# Patient Record
Sex: Female | Born: 1975 | Race: Black or African American | Hispanic: No | Marital: Single | Smoking: Never smoker
Health system: Southern US, Community
[De-identification: ages and names within clinical notes are randomized; demographics above are authoritative.]

## PROBLEM LIST (undated history)

## (undated) HISTORY — PX: ABDOMINAL HYSTERECTOMY: SHX81

---

## 2012-08-16 ENCOUNTER — Emergency Department (HOSPITAL_COMMUNITY)
Admission: EM | Admit: 2012-08-16 | Discharge: 2012-08-16 | Disposition: A | Discharge status: Home / Self Care | Payer: 59 | Attending: Emergency Medicine | Admitting: Emergency Medicine

## 2012-08-16 ENCOUNTER — Telehealth (HOSPITAL_COMMUNITY): Payer: Self-pay | Admitting: Emergency Medicine

## 2012-08-16 ENCOUNTER — Encounter (HOSPITAL_COMMUNITY): Payer: Self-pay | Admitting: Emergency Medicine

## 2012-08-16 DIAGNOSIS — M545 Low back pain, unspecified: Secondary | ICD-10-CM | POA: Insufficient documentation

## 2012-08-16 DIAGNOSIS — M549 Dorsalgia, unspecified: Secondary | ICD-10-CM

## 2012-08-16 DIAGNOSIS — I10 Essential (primary) hypertension: Secondary | ICD-10-CM | POA: Insufficient documentation

## 2012-08-16 MED ORDER — TRAMADOL HCL 50 MG PO TABS: 50.0000 mg | ORAL_TABLET | Freq: Four times a day (QID) | ORAL | Status: DC | PRN

## 2012-08-16 MED ORDER — IBUPROFEN 800 MG PO TABS: 800.0000 mg | ORAL_TABLET | Freq: Three times a day (TID) | ORAL | Status: DC

## 2012-08-16 MED ORDER — KETOROLAC TROMETHAMINE 30 MG/ML IJ SOLN
30.0000 mg | Freq: Once | INTRAMUSCULAR | Status: AC
  Administered 2012-08-16: 30 mg via INTRAMUSCULAR
  Filled 2012-08-16: qty 1

## 2012-08-16 NOTE — ED Provider Notes (Signed)
History     CSN: [id number]  Arrival date & time 08/16/12  7829   First MD Initiated Contact with Patient 08/16/12 [phone]      Chief Complaint  Patient presents with  . Back Pain    (Consider location/radiation/quality/duration/timing/severity/associated sxs/prior treatment) HPI  Patient is a morbidly obese 37 year old woman who is in generally good health. 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 He says she works 12 hour shifts at night and sits in a chair for all of those 12 hours watching a monitor. Since she started this job, she has had low back pain. The low back pain is continuous, aching, moderately severe and nonradiating.  She denies radiation of pain as well as paresthesias. She has not had any bowel or bladder incontinence. No genitourinary symptoms. No history of trauma or strain. She says she has tried a heating pad without improvement. She has also tried a lumbar support in her work care without improvement. She finished work today and this hospital and decided to come to the emergency department for evaluation.  History  Substance Use Topics  . Smoking status: Not on file  . Smokeless tobacco: Not on file  . Alcohol Use: Not on file    OB History   Grav Para Term Preterm Abortions TAB SAB Ect Mult Living                  Review of Systems Gen: no weight loss, fevers, chills, night sweats Eyes: no discharge or drainage, no occular pain or visual changes Nose: no epistaxis or rhinorrhea Mouth: no dental pain, no sore throat Neck: no neck pain Lungs: no SOB, cough, wheezing CV: no chest pain, palpitations, dependent edema or orthopnea Abd: no abdominal pain, nausea, vomiting GU: no dysuria or gross hematuria MSK: As per history of present illness, otherwise negative Neuro: no headache, no focal neurologic deficits Skin: no rash Psyche: negative.  Allergies  Review of patient's allergies indicates not on file.  Home Medications  No current outpatient  prescriptions on file.  BP 151/116  Pulse 106  Temp(Src) 98.9 F (37.2 C) (Oral)  Resp 16  SpO2 98%  Physical Exam Gen: well developed and well nourished appearing Head: NCAT Eyes: PERL, EOMI Nose: no epistaixis or rhinorrhea Mouth/throat: mucosa is moist and pink Neck: supple, no stridor Lungs: CTA B, no wheezing, rhonchi or rales cardio vascular: Regular rate and rhythm, no murmur, extremities appear well perfused Abd: soft, notender, nondistended, obese Back: No midline tenderness, there is tenderness to palpation over the paraspinal musculature bilaterally in the lumbar region. Negative straight leg raise Skin: no rashese, wnl Neuro: CN ii-xii grossly intact, no focal deficits, 5 over 5 motor strength is present in all major muscle groups of lower extremities bilaterally, brisk and symmetric patellar tendon reflexes Psyche; normal affect,  calm and cooperative.   ED Course  Procedures     MDM  Patient with what appears to be myofascial low back pain which is likely the result of a combination of obesity, poor core strength and prolonged sitting at work. I have demonstrated and described some stretching excersices and suggested that she explore yoga as a therapy for her pain. We will tx acute pain with Toradol in the ED ad prescribe short course of ibuprofen and tramadol.   The patient is incidentally noted to be hypertensive. She is asymptomatic. I have advised her of this finding and the need to monitor BPs and record several times a week. The patient  has a doctor in [facility] but requests that I refer her to a physician close to this hospital and I will be happy to do so.         Brandt Loosen, MD 08/16/12 [phone]

## 2012-08-16 NOTE — ED Notes (Signed)
PT. REPORTS LOW BACK PAIN ONSET 2 DAYS AGO , DENIES INJURY OR FALL , PAIN WORSE WITH MOVEMENT / CERTAIN POSITIONS , NO URINARY SYMPTOMS , PT. STATED SITTING LONG HOURS AT WORK . AMBULATORY.

## 2012-10-11 ENCOUNTER — Emergency Department (HOSPITAL_COMMUNITY)
Admission: EM | Admit: 2012-10-11 | Discharge: 2012-10-11 | Disposition: A | Discharge status: Home / Self Care | Payer: 59 | Attending: Emergency Medicine | Admitting: Emergency Medicine

## 2012-10-11 ENCOUNTER — Encounter (HOSPITAL_COMMUNITY): Payer: Self-pay

## 2012-10-11 DIAGNOSIS — M5380 Other specified dorsopathies, site unspecified: Secondary | ICD-10-CM | POA: Insufficient documentation

## 2012-10-11 DIAGNOSIS — M545 Low back pain, unspecified: Secondary | ICD-10-CM | POA: Insufficient documentation

## 2012-10-11 DIAGNOSIS — M549 Dorsalgia, unspecified: Secondary | ICD-10-CM

## 2012-10-11 MED ORDER — TRAMADOL HCL 50 MG PO TABS: 50.0000 mg | ORAL_TABLET | Freq: Four times a day (QID) | ORAL | Status: DC | PRN

## 2012-10-11 MED ORDER — GABAPENTIN 400 MG PO CAPS: 400.0000 mg | ORAL_CAPSULE | Freq: Two times a day (BID) | ORAL | Status: DC

## 2012-10-11 MED ORDER — KETOROLAC TROMETHAMINE 30 MG/ML IJ SOLN
30.0000 mg | Freq: Once | INTRAMUSCULAR | Status: AC
  Administered 2012-10-11: 30 mg via INTRAVENOUS
  Filled 2012-10-11: qty 1

## 2012-10-11 MED ORDER — IBUPROFEN 600 MG PO TABS: 600.0000 mg | ORAL_TABLET | Freq: Four times a day (QID) | ORAL | Status: DC | PRN

## 2012-10-11 NOTE — ED Notes (Signed)
Pt reports numbness and tingling that spreads down her left leg and travels to her left knee. Pt states she uses her heating pad and that is provides some relief but not total relief. Pt states she has pain when she walks, lays down, and sits. Pt states she fell four months ago in the shower and has been uncomfortable since. Pt states she worked a lot of overtime this past week. Pt states her pain developed significantly last night from what she was experiencing the past four months. Pt states this pain in unbearable now and she is unable to sleep and work.

## 2012-10-11 NOTE — ED Provider Notes (Signed)
CSN: [id number]     Arrival date & time 10/11/12  0606 History     First MD Initiated Contact with Patient 10/11/12 [phone]     Chief Complaint  Patient presents with  . Back Pain   (Consider location/radiation/quality/duration/timing/severity/associated sxs/prior Treatment) HPI Mariame Rybolt is a 37 y.o.female without any significant PMH presents to the ER with complaints of low back pain that started since her new job in March of 2014. She now has a sedentary job which requires her to sit for 12 hours at a time. She has been seen in the ER before for the same during a previous flair up last month. This is the same. She just got off work and came straight to the ED because her pain is so uncomfortable. She also reports being out of her Gapapentin for the past week. No new injury, no new symptoms. The pain is left sided sharp and sometimes dull, dependent on sitting or laying position with shooting pains going down her leg. No weakness.   No past medical history on file. Past Surgical History  Procedure Laterality Date  . Abdominal hysterectomy     No family history on file. History  Substance Use Topics  . Smoking status: Never Smoker   . Smokeless tobacco: Not on file  . Alcohol Use: No   OB History   Grav Para Term Preterm Abortions TAB SAB Ect Mult Living                 Review of Systems  Review of Systems  Gen: no weight loss, fevers, chills, night sweats  Eyes: no discharge or drainage, no occular pain or visual changes  Nose: no epistaxis or rhinorrhea  Mouth: no dental pain, no sore throat  Neck: no neck pain  Lungs:No wheezing, coughing or hemoptysis CV: no chest pain, palpitations, dependent edema or orthopnea  Abd: no abdominal pain, nausea, vomiting  GU: no dysuria or gross hematuria  MSK:  + back pain Neuro: no headache, no focal neurologic deficits  Skin: no abnormalities Psyche: negative.   Allergies  Review of patient's allergies indicates no known  allergies.  Home Medications   Current Outpatient Rx  Name  Route  Sig  Dispense  Refill  . ibuprofen (ADVIL,MOTRIN) 200 MG tablet   Oral   Take 400 mg by mouth every 6 (six) hours as needed for pain.         Marland Kitchen gabapentin (NEURONTIN) 400 MG capsule   Oral   Take 1 capsule (400 mg total) by mouth 2 (two) times daily.   60 capsule   0   . ibuprofen (ADVIL,MOTRIN) 600 MG tablet   Oral   Take 1 tablet (600 mg total) by mouth every 6 (six) hours as needed for pain.   30 tablet   0   . traMADol (ULTRAM) 50 MG tablet   Oral   Take 1 tablet (50 mg total) by mouth every 6 (six) hours as needed for pain.   20 tablet   0    BP 132/90  Pulse 101  Temp(Src) 98.7 F (37.1 C) (Oral)  Resp 14  SpO2 100% Physical Exam  Nursing note and vitals reviewed. Constitutional: She appears well-developed and well-nourished. No distress.  HENT:  Head: Normocephalic and atraumatic.  Eyes: Pupils are equal, round, and reactive to light.  Neck: Normal range of motion. Neck supple.  Cardiovascular: Normal rate and regular rhythm.   Pulmonary/Chest: Effort normal.  Abdominal: Soft.  Musculoskeletal:  Lumbar back: She exhibits tenderness, pain and spasm. She exhibits normal range of motion, no swelling, no edema, no deformity, no laceration and normal pulse.       Back:  Neurological: She is alert.  Skin: Skin is warm and dry.    ED Course   Procedures (including critical care time)  Labs Reviewed - No data to display No results found. 1. Back pain     MDM  Pt given referral to PCP and ortho. She now has insurance and will be able to follow-up.  Tramadol 30mg  Injection in ED.  Rx: Gapapentin, ultram, Ibuprofen  36 y.o.Tameisha Nies's evaluation in the Emergency Department is complete. It has been determined that no acute conditions requiring further emergency intervention are present at this time. The patient/guardian have been advised of the diagnosis and plan. We have  discussed signs and symptoms that warrant return to the ED, such as changes or worsening in symptoms.  Vital signs are stable at discharge. Filed Vitals:   10/11/12 0634  BP: 132/90  Pulse: 101  Temp:   Resp: 14    Patient/guardian has voiced understanding and agreed to follow-up with the PCP or specialist.   Dorthula Matas, PA-C 10/11/12 8295

## 2012-10-11 NOTE — ED Provider Notes (Signed)
Medical screening examination/treatment/procedure(s) were performed by non-physician practitioner and as supervising physician I was immediately available for consultation/collaboration.  John-Adam Yafet Cline, M.D.     John-Adam Beila Purdie, MD 10/11/12 0803 

## 2012-10-11 NOTE — ED Notes (Signed)
Pt complains of lower back pain onset yesterday no relief with ibuprofen

## 2013-03-29 ENCOUNTER — Emergency Department (HOSPITAL_COMMUNITY): Payer: 59

## 2013-03-29 ENCOUNTER — Encounter (HOSPITAL_COMMUNITY): Payer: Self-pay | Admitting: Emergency Medicine

## 2013-03-29 ENCOUNTER — Emergency Department (HOSPITAL_COMMUNITY)
Admission: EM | Admit: 2013-03-29 | Discharge: 2013-03-29 | Disposition: A | Discharge status: Home / Self Care | Payer: 59 | Attending: Emergency Medicine | Admitting: Emergency Medicine

## 2013-03-29 DIAGNOSIS — R112 Nausea with vomiting, unspecified: Secondary | ICD-10-CM | POA: Diagnosis not present

## 2013-03-29 DIAGNOSIS — R6883 Chills (without fever): Secondary | ICD-10-CM | POA: Insufficient documentation

## 2013-03-29 DIAGNOSIS — B9789 Other viral agents as the cause of diseases classified elsewhere: Secondary | ICD-10-CM | POA: Insufficient documentation

## 2013-03-29 DIAGNOSIS — J811 Chronic pulmonary edema: Secondary | ICD-10-CM | POA: Diagnosis not present

## 2013-03-29 DIAGNOSIS — H9209 Otalgia, unspecified ear: Secondary | ICD-10-CM | POA: Insufficient documentation

## 2013-03-29 DIAGNOSIS — R12 Heartburn: Secondary | ICD-10-CM | POA: Insufficient documentation

## 2013-03-29 DIAGNOSIS — R05 Cough: Secondary | ICD-10-CM | POA: Insufficient documentation

## 2013-03-29 DIAGNOSIS — J988 Other specified respiratory disorders: Secondary | ICD-10-CM

## 2013-03-29 DIAGNOSIS — R51 Headache: Secondary | ICD-10-CM | POA: Insufficient documentation

## 2013-03-29 DIAGNOSIS — R059 Cough, unspecified: Secondary | ICD-10-CM | POA: Diagnosis not present

## 2013-03-29 DIAGNOSIS — J029 Acute pharyngitis, unspecified: Secondary | ICD-10-CM | POA: Diagnosis present

## 2013-03-29 LAB — RAPID STREP SCREEN (MED CTR MEBANE ONLY): Streptococcus, Group A Screen (Direct): NEGATIVE

## 2013-03-29 MED ORDER — KETOROLAC TROMETHAMINE 30 MG/ML IJ SOLN
30.0000 mg | Freq: Once | INTRAMUSCULAR | Status: AC
  Administered 2013-03-29: 30 mg via INTRAVENOUS
  Filled 2013-03-29: qty 1

## 2013-03-29 MED ORDER — HYDROCODONE-HOMATROPINE 5-1.5 MG/5ML PO SYRP: 5.0000 mL | ORAL_SOLUTION | Freq: Four times a day (QID) | ORAL | Status: DC | PRN

## 2013-03-29 MED ORDER — HYDROCODONE-HOMATROPINE 5-1.5 MG/5ML PO SYRP
5.0000 mL | ORAL_SOLUTION | Freq: Once | ORAL | Status: AC
  Administered 2013-03-29: 5 mL via ORAL
  Filled 2013-03-29: qty 5

## 2013-03-29 MED ORDER — ONDANSETRON HCL 4 MG/2ML IJ SOLN
4.0000 mg | Freq: Once | INTRAMUSCULAR | Status: AC
  Administered 2013-03-29: 4 mg via INTRAVENOUS
  Filled 2013-03-29: qty 2

## 2013-03-29 MED ORDER — SODIUM CHLORIDE 0.9 % IV BOLUS (SEPSIS)
1000.0000 mL | Freq: Once | INTRAVENOUS | Status: AC
  Administered 2013-03-29: 1000 mL via INTRAVENOUS

## 2013-03-29 NOTE — Discharge Instructions (Signed)
Please follow up with your primary care physician in 1-2 days. If you do not have one please call one from list below. Your chest x-ray was negative for any pneumonia or bronchitis or other concerning finding. Please take Hycodan as prescribed to help with cough, sore throat. Please do not drive on this medication as it contains a narcotic. Please read all discharge instructions and return precautions.   Upper Respiratory Infection, Adult An upper respiratory infection (URI) is also sometimes known as the common cold. The upper respiratory tract includes the nose, sinuses, throat, trachea, and bronchi. Bronchi are the airways leading to the lungs. Most people improve within 1 week, but symptoms can last up to 2 weeks. A residual cough may last even longer.  CAUSES Many different viruses can infect the tissues lining the upper respiratory tract. The tissues become irritated and inflamed and often become very moist. Mucus production is also common. A cold is contagious. You can easily spread the virus to others by oral contact. This includes kissing, sharing a glass, coughing, or sneezing. Touching your mouth or nose and then touching a surface, which is then touched by another person, can also spread the virus. SYMPTOMS  Symptoms typically develop 1 to 3 days after you come in contact with a cold virus. Symptoms vary from person to person. They may include:  Runny nose.  Sneezing.  Nasal congestion.  Sinus irritation.  Sore throat.  Loss of voice (laryngitis).  Cough.  Fatigue.  Muscle aches.  Loss of appetite.  Headache.  Low-grade fever. DIAGNOSIS  You might diagnose your own cold based on familiar symptoms, since most people get a cold 2 to 3 times a year. Your caregiver can confirm this based on your exam. Most importantly, your caregiver can check that your symptoms are not due to another disease such as strep throat, sinusitis, pneumonia, asthma, or epiglottitis. Blood tests,  throat tests, and X-rays are not necessary to diagnose a common cold, but they may sometimes be helpful in excluding other more serious diseases. Your caregiver will decide if any further tests are required. RISKS AND COMPLICATIONS  You may be at risk for a more severe case of the common cold if you smoke cigarettes, have chronic heart disease (such as heart failure) or lung disease (such as asthma), or if you have a weakened immune system. The very young and very old are also at risk for more serious infections. Bacterial sinusitis, middle ear infections, and bacterial pneumonia can complicate the common cold. The common cold can worsen asthma and chronic obstructive pulmonary disease (COPD). Sometimes, these complications can require emergency medical care and may be life-threatening. PREVENTION  The best way to protect against getting a cold is to practice good hygiene. Avoid oral or hand contact with people with cold symptoms. Wash your hands often if contact occurs. There is no clear evidence that vitamin C, vitamin E, echinacea, or exercise reduces the chance of developing a cold. However, it is always recommended to get plenty of rest and practice good nutrition. TREATMENT  Treatment is directed at relieving symptoms. There is no cure. Antibiotics are not effective, because the infection is caused by a virus, not by bacteria. Treatment may include:  Increased fluid intake. Sports drinks offer valuable electrolytes, sugars, and fluids.  Breathing heated mist or steam (vaporizer or shower).  Eating chicken soup or other clear broths, and maintaining good nutrition.  Getting plenty of rest.  Using gargles or lozenges for comfort.  Controlling fevers  with ibuprofen or acetaminophen as directed by your caregiver.  Increasing usage of your inhaler if you have asthma. Zinc gel and zinc lozenges, taken in the first 24 hours of the common cold, can shorten the duration and lessen the severity of  symptoms. Pain medicines may help with fever, muscle aches, and throat pain. A variety of non-prescription medicines are available to treat congestion and runny nose. Your caregiver can make recommendations and may suggest nasal or lung inhalers for other symptoms.  HOME CARE INSTRUCTIONS   Only take over-the-counter or prescription medicines for pain, discomfort, or fever as directed by your caregiver.  Use a warm mist humidifier or inhale steam from a shower to increase air moisture. This may keep secretions moist and make it easier to breathe.  Drink enough water and fluids to keep your urine clear or pale yellow.  Rest as needed.  Return to work when your temperature has returned to normal or as your caregiver advises. You may need to stay home longer to avoid infecting others. You can also use a face mask and careful hand washing to prevent spread of the virus. SEEK MEDICAL CARE IF:   After the first few days, you feel you are getting worse rather than better.  You need your caregiver's advice about medicines to control symptoms.  You develop chills, worsening shortness of breath, or brown or red sputum. These may be signs of pneumonia.  You develop yellow or brown nasal discharge or pain in the face, especially when you bend forward. These may be signs of sinusitis.  You develop a fever, swollen neck glands, pain with swallowing, or white areas in the back of your throat. These may be signs of strep throat. SEEK IMMEDIATE MEDICAL CARE IF:   You have a fever.  You develop severe or persistent headache, ear pain, sinus pain, or chest pain.  You develop wheezing, a prolonged cough, cough up blood, or have a change in your usual mucus (if you have chronic lung disease).  You develop sore muscles or a stiff neck. Document Released: 08/29/2000 Document Revised: 05/28/2011 Document Reviewed: 07/07/2010 [facility] Patient Information 2014 [place], [place].

## 2013-03-29 NOTE — ED Provider Notes (Signed)
Medical screening examination/treatment/procedure(s) were performed by non-physician practitioner and as supervising physician I was immediately available for consultation/collaboration.    Sully Manzi M Tyresa Prindiville, MD 03/29/13 1817 

## 2013-03-29 NOTE — ED Notes (Signed)
Pt. reports sore throat , body aches , nasal congestion , bilateral ear discomfort / chills onset today .

## 2013-03-29 NOTE — ED Provider Notes (Signed)
Medical screening examination/treatment/procedure(s) were performed by non-physician practitioner and as supervising physician I was immediately available for consultation/collaboration.    Olivia Mackielga M Brysyn Brandenberger, MD 03/29/13 [phone]

## 2013-03-29 NOTE — ED Provider Notes (Signed)
Filed Vitals:   03/29/13 0723  BP: 133/98  Pulse: 93  Temp:   Resp: 17    Medications  sodium chloride 0.9 % bolus 1,000 mL (0 mLs Intravenous Stopped 03/29/13 0709)  ondansetron (ZOFRAN) injection 4 mg (4 mg Intravenous Given 03/29/13 0525)  ketorolac (TORADOL) 30 MG/ML injection 30 mg (30 mg Intravenous Given 03/29/13 0525)  HYDROcodone-homatropine (HYCODAN) 5-1.5 MG/5ML syrup 5 mL (5 mLs Oral Given 03/29/13 0724)    Labs Reviewed  RAPID STREP SCREEN  CULTURE, GROUP A STREP    Results for orders placed during the hospital encounter of 03/29/13  RAPID STREP SCREEN      Result Value Range   Streptococcus, Group A Screen (Direct) NEGATIVE  NEGATIVE   Dg Chest 2 View  03/29/2013   CLINICAL DATA:  Sore throat and shortness of breath for 2 days.  EXAM: CHEST  2 VIEW  COMPARISON:  None.  FINDINGS: The lungs are clear. Heart size is normal. No pneumothorax or pleural fluid. No focal bony abnormality.  IMPRESSION: Negative chest.   Electronically Signed   By: Drusilla Kannerhomas  Dalessio M.D.   On: 03/29/2013 06:52    Patient care acquired from [facility] S. Dammen, PA-C discharge pending CXR results, which are negative, and improvement in tachycardia. Tachycardia has improved after IVF. Will send patient home with symptomatic care. Patient is agreeable to plan. Patient is stable at time of discharge  1. Viral respiratory illness       Jeannetta EllisJennifer L Wyn Nettle, PA-C 03/29/13 1148

## 2013-03-29 NOTE — ED Provider Notes (Signed)
CSN: [id number]     Arrival date & time 03/29/13  0054 History   First MD Initiated Contact with Patient 03/29/13 0501     Chief Complaint  Patient presents with  . Sore Throat   HPI  History provided by the patient. Patient is a 38 year old female with no significant PMH presents with complaints of sore throat, body aches, nasal congestion and cough. Symptoms began 2 days ago. She also reports having nausea and vomiting that began yesterday with decreased appetite. Symptoms have also been associated with some occasional shortness of breath sensations. She denies any chest pains. Does have some diffuse body aches. She did use some over-the-counter cough and cold medicine without significant change in symptoms. She does report that her son has had recent flu symptoms. She denies any other aggravating or alleviating factors. No other associated symptoms.    History reviewed. No pertinent past medical history. Past Surgical History  Procedure Laterality Date  . Abdominal hysterectomy     No family history on file. History  Substance Use Topics  . Smoking status: Never Smoker   . Smokeless tobacco: Not on file  . Alcohol Use: No   OB History   Grav Para Term Preterm Abortions TAB SAB Ect Mult Living                 Review of Systems  Constitutional: Positive for fever, chills and diaphoresis.  HENT: Positive for congestion, ear pain and sore throat.   Respiratory: Positive for cough.   Cardiovascular: Negative for chest pain.  Gastrointestinal: Positive for nausea and vomiting. Negative for diarrhea.  Neurological: Positive for headaches.  All other systems reviewed and are negative.    Allergies  Review of patient's allergies indicates no known allergies.  Home Medications  No current outpatient prescriptions on file. BP 147/96  Pulse 110  Temp(Src) 99.4 F (37.4 C) (Oral)  Resp 20  Wt 227 lb 8 oz (103.193 kg)  SpO2 100% Physical Exam  Nursing note and vitals  reviewed. Constitutional: She is oriented to person, place, and time. She appears well-developed and well-nourished. No distress.  HENT:  Head: Normocephalic.  Right Ear: Tympanic membrane normal.  Left Ear: Tympanic membrane normal.  Mouth/Throat: Oropharynx is clear and moist.  Neck: Normal range of motion. Neck supple.  No meningeal signs  Cardiovascular: Normal rate and regular rhythm.   Pulmonary/Chest: Effort normal and breath sounds normal. No respiratory distress. She has no wheezes. She has no rales.  Abdominal: Soft. There is no tenderness. There is no rebound and no guarding.  Neurological: She is alert and oriented to person, place, and time.  Skin: Skin is warm and dry. No rash noted.  Psychiatric: She has a normal mood and affect. Her behavior is normal.    ED Course  Procedures   DIAGNOSTIC STUDIES: Oxygen Saturation is 98% on room air.    COORDINATION OF CARE:  Nursing notes reviewed. Vital signs reviewed. Initial pt interview and examination performed.   6:12 AM-patient seen and evaluated. Patient in no acute distress. Does not appear severely ill or toxic. Normal respirations O2 sats. He does have mild tachycardia. Has had several episodes of nausea and vomiting with poor by mouth intake. Suspect tachycardia related to dehydration. Discussed work up plan with pt at bedside, which includes chest x-ray. Pt agrees with plan.  Strep test negative. Chest x-ray pending. Patient discussed with Francee Piccolo PA-C.  She will follow CXR.  Treatment plan initiated: Medications  sodium chloride  0.9 % bolus 1,000 mL (1,000 mLs Intravenous New Bag/Given 03/29/13 0522)  ondansetron (ZOFRAN) injection 4 mg (4 mg Intravenous Given 03/29/13 0525)  ketorolac (TORADOL) 30 MG/ML injection 30 mg (30 mg Intravenous Given 03/29/13 0525)   Results for orders placed during the hospital encounter of 03/29/13  RAPID STREP SCREEN      Result Value Range   Streptococcus, Group A  Screen (Direct) NEGATIVE  NEGATIVE     Imaging Review No results found.    MDM  No diagnosis found.     Angus Sellereter S Ricard Faulkner, PA-C 03/29/13 [phone]

## 2013-03-31 LAB — CULTURE, GROUP A STREP

## 2013-04-01 NOTE — Progress Notes (Signed)
ED Antimicrobial Stewardship Positive Culture Follow Up   Ermalinda BarriosLatasha Zeidman is an 38 y.o. female who [facility] with a chief complaint of  Chief Complaint  Patient presents with  . Sore Throat    Recent Results (from the past 720 hour(s))  RAPID STREP SCREEN     Status: None   Collection Time    03/29/13  1:12 AM      Result Value Range Status   Streptococcus, Group A Screen (Direct) NEGATIVE  NEGATIVE Final   Comment: (NOTE)     A Rapid Antigen test may result negative if the antigen level in the     sample is below the detection level of this test. The FDA has not     cleared this test as a stand-alone test therefore the rapid antigen     negative result has reflexed to a Group A Strep culture.  CULTURE, GROUP A STREP     Status: None   Collection Time    03/29/13  1:12 AM      Result Value Range Status   Specimen Description THROAT   Final   Special Requests ADDED [id number]   Final   Culture     Final   Value: STREPTOCOCCUS,BETA HEMOLYTIC NOT GROUP A     [facility]formed at [facility]   Report Status 03/31/2013 FINAL   Final   37yof presented with sore throat, body aches and nasal congestion.  Recommendation: Perform symptom check. If symptoms improving, no treatment is indicated. If symptoms persist/worsening, start Amoxicillin 500mg  PO BID x 7 days.   ED Provider: Junius FinnerErin O'Malley, PA-C   Cleon DewDulaney, [place] Robert 04/01/2013, 9:56 AM Infectious Diseases Pharmacist Phone# [phone]

## 2013-04-01 NOTE — ED Notes (Signed)
Post ED Visit - Positive Culture Follow-up: Successful Patient Follow-Up  Culture assessed and recommendations reviewed by: [x]  Wes Dulaney, Pharm.D., BCPS []  Celedonio MiyamotoJeremy Frens, [address] BCPS []  Georgina PillionElizabeth Martin, [address]Ph[address] BCPS []  ShannonMinh Pham, [place]arm[place] BCPS, AAHIVP []  Estella HuskMichelle Turner, Pharm.D., BCPS, AAHIVP  Positive strept culture  []  Patient discharged without antimicrobial prescription and treatment is now indicated [x]  Organism is resistant to prescribed ED discharge antimicrobial []  Patient with positive blood cultures  Changes discussed with ED provider:Erin O'Mallery New antibiotic prescription: Amoxicillin 500 mg po BID x 7 days  Contacted patient:Patient refused rx due to abx giving her yeast infections.     Larena Soxunnally, Braleigh Massoud Marie 04/01/2013, 4:46 PM

## 2013-05-15 ENCOUNTER — Emergency Department (HOSPITAL_COMMUNITY)
Admission: EM | Admit: 2013-05-15 | Discharge: 2013-05-15 | Disposition: A | Discharge status: Home / Self Care | Payer: 59 | Attending: Emergency Medicine | Admitting: Emergency Medicine

## 2013-05-15 ENCOUNTER — Emergency Department (HOSPITAL_COMMUNITY): Payer: 59

## 2013-05-15 ENCOUNTER — Encounter (HOSPITAL_COMMUNITY): Payer: Self-pay | Admitting: Emergency Medicine

## 2013-05-15 DIAGNOSIS — IMO0002 Reserved for concepts with insufficient information to code with codable children: Secondary | ICD-10-CM | POA: Insufficient documentation

## 2013-05-15 DIAGNOSIS — W010XXA Fall on same level from slipping, tripping and stumbling without subsequent striking against object, initial encounter: Secondary | ICD-10-CM | POA: Insufficient documentation

## 2013-05-15 DIAGNOSIS — W19XXXA Unspecified fall, initial encounter: Secondary | ICD-10-CM

## 2013-05-15 DIAGNOSIS — Y939 Activity, unspecified: Secondary | ICD-10-CM | POA: Insufficient documentation

## 2013-05-15 DIAGNOSIS — Y9289 Other specified places as the place of occurrence of the external cause: Secondary | ICD-10-CM | POA: Insufficient documentation

## 2013-05-15 DIAGNOSIS — M549 Dorsalgia, unspecified: Secondary | ICD-10-CM

## 2013-05-15 MED ORDER — OXYCODONE-ACETAMINOPHEN 5-325 MG PO TABS
2.0000 | ORAL_TABLET | Freq: Once | ORAL | Status: AC
  Administered 2013-05-15: 2 via ORAL
  Filled 2013-05-15: qty 2

## 2013-05-15 MED ORDER — OXYCODONE-ACETAMINOPHEN 5-325 MG PO TABS: 1.0000 | ORAL_TABLET | ORAL | Status: DC | PRN

## 2013-05-15 MED ORDER — ONDANSETRON HCL 4 MG/2ML IJ SOLN
4.0000 mg | Freq: Once | INTRAMUSCULAR | Status: DC
  Filled 2013-05-15: qty 2

## 2013-05-15 MED ORDER — ONDANSETRON 4 MG PO TBDP
4.0000 mg | ORAL_TABLET | Freq: Once | ORAL | Status: AC
  Administered 2013-05-15: 4 mg via ORAL
  Filled 2013-05-15: qty 1

## 2013-05-15 NOTE — ED Notes (Signed)
Abigail, PA at bedside 

## 2013-05-15 NOTE — Discharge Instructions (Signed)
SEEK IMMEDIATE MEDICAL ATTENTION IF: New numbness, tingling, weakness, or problem with the use of your arms or legs.  Severe back pain not relieved with medications.  Change in bowel or bladder control.  Increasing pain in any areas of the body (such as chest or abdominal pain).  Shortness of breath, dizziness or fainting.  Nausea (feeling sick to your stomach), vomiting, fever, or sweats.  Back Pain, Adult Low back pain is very common. About 1 in 5 people have back pain.The cause of low back pain is rarely dangerous. The pain often gets better over time.About half of people with a sudden onset of back pain feel better in just 2 weeks. About 8 in 10 people feel better by 6 weeks.  CAUSES Some common causes of back pain include:  Strain of the muscles or ligaments supporting the spine.  Wear and tear (degeneration) of the spinal discs.  Arthritis.  Direct injury to the back. DIAGNOSIS Most of the time, the direct cause of low back pain is not known.However, back pain can be treated effectively even when the exact cause of the pain is unknown.Answering your caregiver's questions about your overall health and symptoms is one of the most accurate ways to make sure the cause of your pain is not dangerous. If your caregiver needs more information, he or she may order lab work or imaging tests (X-rays or MRIs).However, even if imaging tests show changes in your back, this usually does not require surgery. HOME CARE INSTRUCTIONS For many people, back pain returns.Since low back pain is rarely dangerous, it is often a condition that people can learn to [facility] their own.   Remain active. It is stressful on the back to sit or stand in one place. Do not sit, drive, or stand in one place for more than 30 minutes at a time. Take short walks on level surfaces as soon as pain allows.Try to increase the length of time you walk each day.  Do not stay in bed.Resting more than 1 or 2 days can delay  your recovery.  Do not avoid exercise or work.Your body is made to move.It is not dangerous to be active, even though your back may hurt.Your back will likely heal faster if you return to being active before your pain is gone.  Pay attention to your body when you bend and lift. Many people have less discomfortwhen lifting if they bend their knees, keep the load close to their bodies,and avoid twisting. Often, the most comfortable positions are those that put less stress on your recovering back.  Find a comfortable position to sleep. Use a firm mattress and lie on your side with your knees slightly bent. If you lie on your back, put a pillow under your knees.  Only take over-the-counter or prescription medicines as directed by your caregiver. Over-the-counter medicines to reduce pain and inflammation are often the most helpful.Your caregiver may prescribe muscle relaxant drugs.These medicines help dull your pain so you can more quickly return to your normal activities and healthy exercise.  Put ice on the injured area.  Put ice in a plastic bag.  Place a towel between your skin and the bag.  Leave the ice on for 15-20 minutes, 03-04 times a day for the first 2 to 3 days. After that, ice and heat may be alternated to reduce pain and spasms.  Ask your caregiver about trying back exercises and gentle massage. This may be of some benefit.  Avoid feeling anxious or stressed.Stress  increases muscle tension and can worsen back pain.It is important to recognize when you are anxious or stressed and learn ways to manage it.Exercise is a great option. SEEK MEDICAL CARE IF:  You have pain that is not relieved with rest or medicine.  You have pain that does not improve in 1 week.  You have new symptoms.  You are generally not feeling well. SEEK IMMEDIATE MEDICAL CARE IF:   You have pain that radiates from your back into your legs.  You develop new bowel or bladder control  problems.  You have unusual weakness or numbness in your arms or legs.  You develop nausea or vomiting.  You develop abdominal pain.  You feel faint. Document Released: 03/05/2005 Document Revised: 09/04/2011 Document Reviewed: 07/24/2010 [facility] Patient Information 2014 [place], [place].   Emergency Department Resource Guide 1) Find a Doctor and Pay Out of Pocket Although you won't have to find out who is covered by your insurance plan, it is a good idea to ask around and get recommendations. You will then need to call the office and see if the doctor you have chosen will accept you as a new patient and what types of options they offer for patients who are self-pay. Some doctors offer discounts or will set up payment plans for their patients who do not have insurance, but you will need to ask so you aren't surprised when you get to your appointment.  2) Contact Your Local Health Department Not all health departments have doctors that can see patients for sick visits, but many do, so it is worth a call to see if yours does. If you don't know where your local health department is, you can check in your phone book. The CDC also has a tool to help you locate your state's health department, and many state websites also have listings of all of their local health departments.  3) Find a Walk-in Clinic If your illness is not likely to be very severe or complicated, you may want to try a walk in clinic. These are popping up all over the country in pharmacies, drugstores, and shopping centers. They're usually staffed by nurse practitioners or physician assistants that have been trained to treat common illnesses and complaints. They're usually fairly quick and inexpensive. However, if you have serious medical issues or chronic medical problems, these are probably not your best option.  No Primary Care Doctor: - Call Health Connect at  [phone] - they can help you locate a primary care doctor that   accepts your insurance, provides certain services, etc. - Physician Referral Service- [phone]  Chronic Pain Problems: Organization         Address  Phone   Notes  [facility]  [phone] Patients need to be referred by their primary care doctor.   Medication Assistance: Organization         Address  Phone   Notes  [facility] Medication [facility] [address] [place], [place] [address] [phone] --Must be a resident of [facility] -- Must have NO insurance coverage whatsoever (no Medicaid/ Medicare, etc.) -- The pt. MUST have a primary care doctor that directs their care regularly and follows them in the community   Med[facility]ist  [phone]   [employer]  [phone]    Agencies that provide inexpensive medical care: Organization         Address  Phone   Notes  [facility]  (  [phone]   [facility]    [phone]   [facility] [place], [place] [address] [phone]   [facility] [address], [place] [phone]   Planned Parenthood    [phone]   [facility]    [phone]   [place] and [place] [address], [place] Phone:  [phone], Fax:  [phone] Hours of Operation:  9 am - 6 pm, M-F.  Also accepts Medicaid/Medicare and self-pay.  [facility] for [place], [address], [place] Phone: [phone], Fax: [phone]. Hours of Operation:  8:30 am - 5:30 pm, M-F.  Also accepts Medicaid and self-pay.  [facility] [place] [address], [place] Phone: [phone]   [place], [place], [place] [phone], Ext. [phone] Mondays & Thursdays: 7-9 AM.  First 15 patients are seen on a first come, first serve basis.    [place] Providers:  Organization          Address  Phone   Notes  [facility] [place] [facility] [address], [place] [phone] Also accepts self-pay patients.  [facility] [address], [place]  [phone]   [place], [address], [place] [phone]   [facility] [address], [place] [phone]   Lucianne Lei [address] [place], [place]   [phone] Only accepts [place] Access [place] patients after they have their name applied to their card.   Self-Pay (no insurance) in [facility]:  Organization         Address  Phone   Notes  Sickle Cell Patients, [facility] [place] [phone]   [facility] [place] [phone]   [facility] [place]  [place], [place], [place] [phone]   [facility]/Dr. Osei-Bonsu  [address] [place] or [address], [place] [phone] Phone number for both [place] and [place] locations is the same.  [facility] [address], [place] [phone]   [facility] [address], [place] or [address] [phone]   [facility] [address] [place] [phone], phone; [phone], fax Sees patients 1st and 3rd Saturday of every month.  Must not qualify for public or private insurance (i.e. Medicaid, Medicare, [place] Health Choice, Veterans' Benefits)  Household income should be no more than 200% of the poverty level The clinic cannot treat you if you are pregnant or think you are pregnant  Sexually transmitted diseases are not treated at the clinic.    Dental Care: Organization         Address  Phone  Notes  [facility] Department of [facility] [place] [phone] Accepts children up to age 18 who are enrolled in [place] or [place]; pregnant women with a Medicaid card; and children who have applied for Medicaid or [place] Health Choice, but were declined, whose parents can pay a reduced fee at time of service.  [facility] [place]  [address], [employer] [phone] Accepts children up to age 28 who are enrolled in Medicaid or [place]; pregnant women with a Medicaid card; and children who have applied for Medicaid or [place] Health Choice, but were declined, whose parents can pay a reduced fee at time of service.  [place] Adult Dental Access PROGRAM  [place] [phone] Patients are seen by appointment only. Walk-ins are not accepted. [place] will see patients 28 years of age and older. Monday - Tuesday (8am-5pm) Most Wednesdays (8:30-5pm) $30 per visit, cash only  [facility] Adult Dental Access PROGRAM  [address], [facility] [phone] Patients are seen by appointment only. Walk-ins are not accepted. [place] will see patients 13 years of age and older. One Wednesday Evening (Monthly: Volunteer Based).  $30 per visit, cash only  [place]  [phone] for adults; Children under age 64, call Graduate Pediatric Dentistry at [phone]. Children aged 63-14, please call [phone] to request a pediatric application.  Dental services are provided in all areas of dental care including fillings, crowns and bridges, complete and partial dentures, implants, gum treatment, root canals, and extractions. Preventive care is also provided. Treatment is provided to both adults and children. Patients are selected via a lottery and there is often a waiting list.   [facility] [address] [place]  [phone] [email or web address]   [facility] [address] [place], [place]  [phone], Ext. [phone] Second and Fourth Thursday of each month, opens at 6:30 AM; Clinic ends at 9 AM.  Patients are seen on a first-come first-served basis, and a limited number are seen during each clinic.   [facility]  [address] Hillard Danker [place], [place] [phone]   Eligibility Requirements You must have lived in [place], [place], or [place] counties for at least the last three months.   You cannot be eligible for state or federal sponsored [employer], including [employer], [place], or [employer].   You generally cannot be eligible for healthcare insurance through your employer.    How to apply: Eligibility screenings are held every Tuesday and Wednesday afternoon from 1:00 pm until 4:00 pm. You do not need an appointment for the interview!  [facility] [address], [place], [place]   [place]  [place] Department  [place]  [phone]    Behavioral Health Resources in the Community: Intensive Outpatient Programs Organization         Address  Phone  Notes  Gwynn Quogue. [address] [place], [place] [phone]   [facility] [address], [place], [place]   ADS: Alcohol & Drug Svcs [address] [place], [place]   [place] [address],  [place], [place] or [phone]   Substance Abuse Resources Organization         Address  Phone  Notes  Alcohol and Drug Services  [phone]   [place]  [phone]   [facility] [place]   Chinita Pester  [phone]   Residential & Outpatient Substance Abuse Program  [phone]   Psychological Services Organization         Address  Phone  Notes  [place]  Services  [phone]- [phone]    [facility] [address], [place] [phone] or [phone]    Mobile Crisis Teams Organization         Address  Phone  Notes  [facility], Mobile Crisis Care Unit  [phone]   [facility]  [address] [place], [place] [phone]   Doristine LocksSharon DeEsch [address] [place] [phone]    Self-Help/Support Groups Organization         Address  Phone             Notes  [facility]ental Health Assoc. of [place] - variety of support groups  336- [phone] Call for more information  Narcotics Anonymous (NA), Caring Services [address], [place]  2 meetings at this location   Statisticianesidential Treatment Programs Organization         Address  Phone  Notes  [facility] [address],    [place]  [phone]   [facility]  [address], [place] [phone], [place], [place] [phone]   [facility] [address], [place] [phone] Admissions: 8am-3pm M-F  [facility] [address]    [place], [place] [phone]   [facility] [address], [place], [place] [phone]   [facility] [address]  [place], [place] [phone]   [facility] - Intensive Outpatient [address], [place], [place] [phone]   [facility] ([facility] [address]  [place], [place] [phone] or [phone]   [facility] Services (RTS) [address] [place], [place] [phone] Accepts Medicaid  [facility] [address]  [place] [phone] Substance Abuse/Addiction Treatment   [facility] Behavioral Health Resources Organization         Address  Phone  Notes  [facility]  [phone]) [phone]   Angie FavaJulie Brannon, PhD [address], [address] [place], [place]   [phone] or [phone]) [phone]   [facility]   [address] [place], [place] [phone]) [phone]   [facility] [address], [place], [place] [phone](336) [phone] Insurance/Medicaid/sponsorship through [facility]  [facility] [address]                                    [place], [place] [phone](336) [phone] Therapy/tele-psych/case  [facility] [address]   [place], [place] [phone]    Dr. Lolly MustacheArfeen  [phone](336) [phone]   [facility] [place]  [employer] [facility] 1) [address], [place] 2) [address], [place] 3)  [address], [place] [phone]) [phone]  [phone]) [phone]   [facility] [place] [place] Child Abuse Hotline [phone]) 560-3062(336) [phone]) [phone] or [phone](336) [phone] (After Hours)

## 2013-05-15 NOTE — ED Notes (Addendum)
Fell on some black ice. Hitting left side - left hip hurting.  Have chronic back pain and left sided weakness...trying to be careful.

## 2013-05-15 NOTE — ED Provider Notes (Signed)
CSN: [id number]     Arrival date & time 05/15/13  0610 History   First MD Initiated Contact with Patient 05/15/13 [phone]     Chief Complaint  Patient presents with  . Fall     (Consider location/radiation/quality/duration/timing/severity/associated sxs/prior Treatment) HPI   Tammy Sloan is a 38 y.o. female who complains of an injury causing low back pain and L Hip just PTA. The pain is positional with bending or lifting, with radiation down the L leg. Mechanism of injury: Patient fell on the Left side. Symptoms have been acute since that time. Prior history of back problems: recurrent self limited episodes of low back pain in the past. There is no numbness in the legs.Denies weakness, loss of bowel/bladder function or saddle anesthesia. Denies neck stiffness, headache, rash.  Denies fever or recent procedures to back.      No past medical history on file. Past Surgical History  Procedure Laterality Date  . Abdominal hysterectomy     No family history on file. History  Substance Use Topics  . Smoking status: Never Smoker   . Smokeless tobacco: Not on file  . Alcohol Use: No   OB History   Grav Para Term Preterm Abortions TAB SAB Ect Mult Living                 Review of Systems  Constitutional: Positive for activity change. Negative for fever and chills.  Respiratory: Negative for shortness of breath.   Cardiovascular: Negative for chest pain.  Gastrointestinal: Negative for nausea, vomiting and abdominal pain.  Genitourinary: Negative for dysuria, hematuria, flank pain and pelvic pain.  Musculoskeletal: Positive for back pain and gait problem. Negative for neck pain and neck stiffness.  Skin: Negative for rash.  Neurological: Negative for weakness, numbness and headaches.         Allergies  Review of patient's allergies indicates no known allergies.  Home Medications   Current Outpatient Rx  Name  Route  Sig  Dispense  Refill  . HYDROcodone-homatropine  (HYCODAN) 5-1.5 MG/5ML syrup   Oral   Take 5 mLs by mouth every 6 (six) hours as needed for cough (sore throat).   120 mL   0    There were no vitals taken for this visit. Physical Exam  Constitutional: She is oriented to person, place, and time. She appears well-developed and well-nourished. No distress.  HENT:  Head: Normocephalic and atraumatic.  Eyes: Conjunctivae are normal. No scleral icterus.  Neck: Normal range of motion.  Cardiovascular: Normal rate, regular rhythm and normal heart sounds.  Exam reveals no gallop and no friction rub.   No murmur heard. Pulmonary/Chest: Effort normal and breath sounds normal. No respiratory distress.  Abdominal: Soft. Bowel sounds are normal. She exhibits no distension and no mass. There is no tenderness. There is no guarding.  Neurological: She is alert and oriented to person, place, and time.  Skin: Skin is warm and dry. No rash noted. She is not diaphoretic.    ED Course  Procedures (including critical care time) Labs Review Labs Reviewed - No data to display Imaging Review No results found.  EKG Interpretation   None       MDM   Final diagnoses:  Fall  Back pain    Patient with back pain.  No neurological deficits and normal neuro exam.  Patient can walk but states is painful.  No loss of bowel or bladder control.  No concern for cauda equina.  No fever, night sweats,  weight loss, h/o cancer, IVDU.  RICE protocol and pain medicine indicated and discussed with patient. Arthor Captain, PA-C 05/15/13 [phone]

## 2013-05-15 NOTE — ED Notes (Signed)
Harris, PA at bedside.  

## 2013-05-15 NOTE — ED Notes (Addendum)
Pt states that she was leaving work, when she slipped and fell on black ice, landing on her left hip. Pt reports chronic lower back pain and left leg weakness, and when she fell, she states "pain shot through my leg".

## 2013-05-15 NOTE — ED Notes (Signed)
Vital signs stable. 

## 2013-05-16 NOTE — ED Provider Notes (Signed)
Medical screening examination/treatment/procedure(s) were performed by non-physician practitioner and as supervising physician I was immediately available for consultation/collaboration.   EKG Interpretation None        Loys Shugars, MD 05/16/13 0746 

## 2013-09-18 ENCOUNTER — Encounter (HOSPITAL_COMMUNITY): Payer: Self-pay | Admitting: Emergency Medicine

## 2013-09-18 ENCOUNTER — Emergency Department (HOSPITAL_COMMUNITY)
Admission: EM | Admit: 2013-09-18 | Discharge: 2013-09-18 | Disposition: A | Discharge status: Home / Self Care | Payer: 59 | Attending: Emergency Medicine | Admitting: Emergency Medicine

## 2013-09-18 DIAGNOSIS — M545 Low back pain, unspecified: Secondary | ICD-10-CM | POA: Insufficient documentation

## 2013-09-18 MED ORDER — DIAZEPAM 2 MG PO TABS
2.0000 mg | ORAL_TABLET | Freq: Once | ORAL | Status: AC
Start: 2013-09-18 — End: 2013-09-18
  Administered 2013-09-18: 2 mg via ORAL
  Filled 2013-09-18: qty 1

## 2013-09-18 MED ORDER — MORPHINE SULFATE 4 MG/ML IJ SOLN
4.0000 mg | Freq: Once | INTRAMUSCULAR | Status: AC
  Administered 2013-09-18: 4 mg via INTRAMUSCULAR
  Filled 2013-09-18: qty 1

## 2013-09-18 MED ORDER — HYDROCODONE-ACETAMINOPHEN 5-325 MG PO TABS: 1.0000 | ORAL_TABLET | ORAL | Status: DC | PRN

## 2013-09-18 MED ORDER — IBUPROFEN 800 MG PO TABS
800.0000 mg | ORAL_TABLET | Freq: Once | ORAL | Status: AC
  Administered 2013-09-18: 800 mg via ORAL
  Filled 2013-09-18: qty 1

## 2013-09-18 MED ORDER — CYCLOBENZAPRINE HCL 10 MG PO TABS: 10.0000 mg | ORAL_TABLET | Freq: Two times a day (BID) | ORAL | Status: DC | PRN

## 2013-09-18 MED ORDER — PREDNISONE 10 MG PO TABS: 20.0000 mg | ORAL_TABLET | Freq: Every day | ORAL | Status: DC

## 2013-09-18 NOTE — ED Provider Notes (Signed)
CSN: [id number]     Arrival date & time 09/18/13  0606 History   First MD Initiated Contact with Patient 09/18/13 [phone]     Chief Complaint  Patient presents with  . Back Pain     (Consider location/radiation/quality/duration/timing/severity/associated sxs/prior Treatment) HPI  Patient to the ER with complaints of low back pain. She says that she has been moving from an apartment into her sisters house and has not had any help. She denies having any specific incident to cause her pain but reports after the first day she was sore and then progressively the pain got worse. She has also been working extra shifts and reports her chair is very uncomfortable. She says that she has been very stressed out and not sleeping as much. The pain is low back and bilateral, worse with movement. No difficulties walking, no numbness, tingling or loss of bowel or bladder control. She has tried Motrin at home but reports "its not cutting it".  History reviewed. No pertinent past medical history. Past Surgical History  Procedure Laterality Date  . Abdominal hysterectomy     History reviewed. No pertinent family history. History  Substance Use Topics  . Smoking status: Never Smoker   . Smokeless tobacco: Not on file  . Alcohol Use: No   OB History   Grav Para Term Preterm Abortions TAB SAB Ect Mult Living                 Review of Systems   Review of Systems  Gen: no weight loss, fevers, chills, night sweats  Eyes: no discharge or drainage, no occular pain or visual changes  Nose: no epistaxis or rhinorrhea  Mouth: no dental pain, no sore throat  Neck: no neck pain  Lungs:No wheezing, coughing or hemoptysis CV: no chest pain, palpitations, dependent edema or orthopnea  Abd: no abdominal pain, nausea, vomiting, diarrhea GU: no dysuria or gross hematuria  MSK:  No muscle weakness, + low back pain Neuro: no headache, no focal neurologic deficits  Skin: no rash or wounds Psyche: no  complaints    Allergies  Review of patient's allergies indicates no known allergies.  Home Medications   Prior to Admission medications   Medication Sig Start Date End Date Taking? Authorizing Provider  cyclobenzaprine (FLEXERIL) 10 MG tablet Take 1 tablet (10 mg total) by mouth 2 (two) times daily as needed for muscle spasms. 09/18/13   Dorthula Matasiffany G Severo Beber, PA-C  HYDROcodone-acetaminophen (NORCO/VICODIN) 5-325 MG per tablet Take 1-2 tablets by mouth every 4 (four) hours as needed. 09/18/13   Sanad Fearnow Irine SealG Mikesha Migliaccio, PA-C  predniSONE (DELTASONE) 10 MG tablet Take 2 tablets (20 mg total) by mouth daily. 09/18/13   Makinsley Schiavi Irine SealG Avaneesh Pepitone, PA-C   BP 137/83  Pulse 78  Temp(Src) 98.6 F (37 C) (Oral)  Resp 18  Ht 5\' 5"  (1.651 m)  Wt 211 lb (95.709 kg)  BMI 35.11 kg/m2  SpO2 99% Physical Exam  Nursing note and vitals reviewed. Constitutional: She appears well-developed and well-nourished. No distress.  HENT:  Head: Normocephalic and atraumatic.  Eyes: Pupils are equal, round, and reactive to light.  Neck: Normal range of motion. Neck supple.  Cardiovascular: Normal rate and regular rhythm.   Pulmonary/Chest: Effort normal.  Abdominal: Soft.  Musculoskeletal:       Back:  Pt has equal strength to bilateral lower extremities.  Neurosensory function adequate to both legs No clonus on dorsiflextion Skin color is normal. Skin is warm and moist.  I see no  step off deformity, no midline bony tenderness.  Pt is able to ambulate without limp.  No crepitus, laceration, effusion, induration, lesions, swelling.   Pedal pulses are symmetrical and palpable bilaterally  bilateral tenderness to palpation of paraspinal lumbar muscles    Neurological: She is alert.  Skin: Skin is warm and dry.    ED Course  Procedures (including critical care time) Labs Review Labs Reviewed - No data to display  Imaging Review No results found.   EKG Interpretation None      MDM   Final diagnoses:  Bilateral  low back pain without sciatica    38 y.o.Tammy Sloan  with back pain. No neurological deficits and normal neuro exam. Patient can walk but states is painful. No loss of bowel or bladder control. No concern for cauda equina. No fever, night sweats, weight loss, h/o cancer, IVDU. RICE protocol and pain medicine indicated and discussed with patient.   Medications  morphine 4 MG/ML injection 4 mg (4 mg Intramuscular Given 09/18/13 0708)  diazepam (VALIUM) tablet 2 mg (2 mg Oral Given 09/18/13 0707)  ibuprofen (ADVIL,MOTRIN) tablet 800 mg (800 mg Oral Given 09/18/13 0707)   Pt reports the pain medications has moderately improved her pain. When I came into the room she was asleep in her exam room bed.   Will prescribe: She has been given referral to Ortho. cyclobenzaprine (FLEXERIL) 10 MG tablet Take 1 tablet (10 mg total) by mouth 2 (two) times daily as needed for muscle spasms. 20 tablet Dorthula Matasiffany G Krystine Pabst, PA-C  HYDROcodone-acetaminophen (NORCO/VICODIN) 5-325 MG per tablet Take 1-2 tablets by mouth every 4 (four) hours as needed. 20 tablet Dorthula Matasiffany G Raylen Ken, PA-C  predniSONE (DELTASONE) 10 MG tablet Take 2 tablets (20 mg total) by mouth daily. 21 tablet Dorthula Matasiffany G Rosenda Geffrard, PA-C   Patient Plan 1. Medications: pain medication, muscle relaxer and usual home medications  2. Treatment: rest, drink plenty of fluids, gentle stretching as discussed, alternate ice and heat  3. Follow Up: Please followup with your primary doctor for discussion of your diagnoses and further evaluation after today's visit; if you do not have a primary care doctor use the resource guide provided to find one   Vital signs are stable at discharge. Filed Vitals:   09/18/13 0756  BP: 137/83  Pulse: 78  Temp:   Resp: 18    Patient/guardian has voiced understanding and agreed to follow-up with the PCP or specialist.         Dorthula Matasiffany G Sura Canul, PA-C 09/18/13 [phone]

## 2013-09-18 NOTE — Discharge Instructions (Signed)

## 2013-09-18 NOTE — ED Notes (Addendum)
Pt states that she moved places she was living and she has been working extra. Pt states lower back pain since this weekend. Pt can point to the location where she has pain. Pt states pain increases since this weekend.

## 2013-09-28 NOTE — ED Provider Notes (Signed)
Medical screening examination/treatment/procedure(s) were performed by non-physician practitioner and as supervising physician I was immediately available for consultation/collaboration.   EKG Interpretation None        Brandt LoosenJulie Manly, MD 09/28/13 [phone]

## 2013-11-02 ENCOUNTER — Encounter (HOSPITAL_COMMUNITY): Payer: Self-pay | Admitting: Emergency Medicine

## 2013-11-02 ENCOUNTER — Emergency Department (HOSPITAL_COMMUNITY)
Admission: EM | Admit: 2013-11-02 | Discharge: 2013-11-02 | Disposition: A | Discharge status: Home / Self Care | Payer: 59 | Attending: Emergency Medicine | Admitting: Emergency Medicine

## 2013-11-02 DIAGNOSIS — M5416 Radiculopathy, lumbar region: Secondary | ICD-10-CM

## 2013-11-02 DIAGNOSIS — M7989 Other specified soft tissue disorders: Secondary | ICD-10-CM | POA: Diagnosis present

## 2013-11-02 DIAGNOSIS — IMO0002 Reserved for concepts with insufficient information to code with codable children: Secondary | ICD-10-CM | POA: Diagnosis not present

## 2013-11-02 MED ORDER — METHOCARBAMOL 500 MG PO TABS: 1000.0000 mg | ORAL_TABLET | Freq: Four times a day (QID) | ORAL | Status: AC

## 2013-11-02 MED ORDER — IBUPROFEN 600 MG PO TABS: 600.0000 mg | ORAL_TABLET | Freq: Four times a day (QID) | ORAL | Status: AC | PRN

## 2013-11-02 MED ORDER — HYDROCODONE-ACETAMINOPHEN 5-325 MG PO TABS: ORAL_TABLET | ORAL | Status: AC

## 2013-11-02 MED ORDER — PREDNISONE 20 MG PO TABS: ORAL_TABLET | ORAL | Status: AC

## 2013-11-02 MED ORDER — HYDROCODONE-ACETAMINOPHEN 5-325 MG PO TABS
2.0000 | ORAL_TABLET | Freq: Once | ORAL | Status: AC
  Administered 2013-11-02: 2 via ORAL
  Filled 2013-11-02: qty 2

## 2013-11-02 NOTE — ED Provider Notes (Signed)
CSN: [id number]     Arrival date & time 11/02/13  0606 History   First MD Initiated Contact with Patient 11/02/13 [phone]     Chief Complaint  Patient presents with  . Leg Swelling     (Consider location/radiation/quality/duration/timing/severity/associated sxs/prior Treatment) HPI Comments: Patient with h/o low back pain presents with exacerbation of her usual low back pain. Patient denies new injury. For 3-4 days she has had burning pain into both legs into her feet, L>R. Burning is more extensive than in past. She is ambulatory at baseline. No weakness in legs. No upper extremity symptoms. No vision symptoms. No red flag s/s of low back pain.   The history is provided by the patient and medical records.    History reviewed. No pertinent past medical history. Past Surgical History  Procedure Laterality Date  . Abdominal hysterectomy     No family history on file. History  Substance Use Topics  . Smoking status: Never Smoker   . Smokeless tobacco: Not on file  . Alcohol Use: No   OB History   Grav Para Term Preterm Abortions TAB SAB Ect Mult Living                 Review of Systems  Constitutional: Negative for fever and unexpected weight change.  Gastrointestinal: Negative for constipation.       Negative for fecal incontinence.   Genitourinary: Negative for dysuria, hematuria, flank pain, vaginal bleeding, vaginal discharge and pelvic pain.       Negative for urinary incontinence or retention.  Musculoskeletal: Positive for back pain.  Neurological: Negative for weakness and numbness.       Denies saddle paresthesias.   Allergies  Review of patient's allergies indicates no known allergies.  Home Medications   Prior to Admission medications   Not on File   BP 111/81  Pulse 115  Temp(Src) 98.6 F (37 C) (Oral)  Resp 18  Ht 5\' 5"  (1.651 m)  Wt 190 lb (86.183 kg)  BMI 31.62 kg/m2  SpO2 100%  Physical Exam  Nursing note and vitals reviewed. Constitutional: She  appears well-developed and well-nourished.  HENT:  Head: Normocephalic and atraumatic.  Eyes: Conjunctivae are normal.  Neck: Normal range of motion. Neck supple.  Cardiovascular:  Pulses:      Femoral pulses are 2+ on the right side, and 2+ on the left side.      Popliteal pulses are 2+ on the right side, and 2+ on the left side.  Pulmonary/Chest: Effort normal.  Abdominal: Soft. There is no tenderness. There is no CVA tenderness.  Musculoskeletal: Normal range of motion.  No step-off noted with palpation of spine.   Neurological: She is alert. She has normal strength and normal reflexes. No sensory deficit.  5/5 strength in entire lower extremities bilaterally. Patient c/o decreased sensation of L anterior lower leg, L medial thigh. No foot drop with ambulation. Steady gait.   Skin: Skin is warm and dry. No rash noted.  Psychiatric: She has a normal mood and affect.    ED Course  Procedures (including critical care time) Labs Review Labs Reviewed - No data to display  Imaging Review No results found.   EKG Interpretation None      7:21 AM Patient seen and examined. Medications ordered.   Vital signs reviewed and are as follows: BP 111/81  Pulse 115  Temp(Src) 98.6 F (37 C) (Oral)  Resp 18  Ht 5\' 5"  (1.651 m)  Wt 190 lb (86.183  kg)  BMI 31.62 kg/m2  SpO2 100%  PCP/neurosurg referral given.   No red flag s/s of low back pain. Patient was counseled on back pain precautions and told to do activity as tolerated but do not lift, push, or pull heavy objects more than 10 pounds for the next week.  Patient counseled to use ice or heat on back for no longer than 15 minutes every hour.   Patient prescribed muscle relaxer and counseled on proper use of muscle relaxant medication.    Patient prescribed narcotic pain medicine and counseled on proper use of narcotic pain medications. Counseled not to combine this medication with others containing tylenol.   Urged patient not  to drink alcohol, drive, or perform any other activities that requires focus while taking either of these medications.  Patient urged to follow-up with PCP if pain does not improve with treatment and rest or if pain becomes recurrent. Urged to return with worsening severe pain, loss of bowel or bladder control, trouble walking.   The patient verbalizes understanding and agrees with the plan.  MDM   Final diagnoses:  Lumbar radiculopathy   Patient with back pain. No significant neurological deficits other than sensation decrease specifically in L LE. Patient is ambulatory. No warning symptoms of back pain including: fecal incontinence, urinary retention or overflow incontinence, night sweats, waking from sleep with back pain, unexplained fevers or weight loss, h/o cancer, IVDU, recent trauma. No concern for cauda equina, epidural abscess, or other serious cause of back pain. Conservative measures such as rest, ice/heat and pain medicine indicated with PCP follow-up if no improvement with conservative management. No emergent MRI indicated.     Renne Crigler, PA-C 11/02/13 [phone]

## 2013-11-02 NOTE — ED Notes (Signed)
The pt is c/o  Bi-lateral leg pain and numbness with tingling.  Hx of the same

## 2013-11-02 NOTE — Discharge Instructions (Signed)
Please read and follow all provided instructions.  Your diagnoses today include:  1. Lumbar radiculopathy     Tests performed today include:  Vital signs - see below for your results today  Medications prescribed:   Vicodin (hydrocodone/acetaminophen) - narcotic pain medication  DO NOT drive or perform any activities that require you to be awake and alert because this medicine can make you drowsy. BE VERY CAREFUL not to take multiple medicines containing Tylenol (also called acetaminophen). Doing so can lead to an overdose which can damage your liver and cause liver failure and possibly death.   Prednisone - steroid medicine   It is best to take this medication in the morning to prevent sleeping problems. If you are diabetic, monitor your blood sugar closely and stop taking Prednisone if blood sugar is over 300. Take with food to prevent stomach upset.    Robaxin (methocarbamol) - muscle relaxer medication  DO NOT drive or perform any activities that require you to be awake and alert because this medicine can make you drowsy.    Ibuprofen (Motrin, Advil) - anti-inflammatory pain medication  Do not exceed 600mg  ibuprofen every 6 hours, take with food  You have been prescribed an anti-inflammatory medication or NSAID. Take with food. Take smallest effective dose for the shortest duration needed for your pain. Stop taking if you experience stomach pain or vomiting.   Take any prescribed medications only as directed.  Home care instructions:   Follow any educational materials contained in this packet  Please rest, use ice or heat on your back for the next several days  Do not lift, push, pull anything more than 10 pounds for the next week  Follow-up instructions: Please follow-up with your primary care provider in the next 1 week for further evaluation of your symptoms.   Return instructions:  SEEK IMMEDIATE MEDICAL ATTENTION IF YOU HAVE:  New numbness, tingling,  weakness, or problem with the use of your arms or legs  Severe back pain not relieved with medications  Loss control of your bowels or bladder  Increasing pain in any areas of the body (such as chest or abdominal pain)  Shortness of breath, dizziness, or fainting.   Worsening nausea (feeling sick to your stomach), vomiting, fever, or sweats  Any other emergent concerns regarding your health   Additional Information:  Your vital signs today were: BP 111/81   Pulse 115   Temp(Src) 98.6 F (37 C) (Oral)   Resp 18   Ht 5\' 5"  (1.651 m)   Wt 190 lb (86.183 kg)   BMI 31.62 kg/m2   SpO2 100% If your blood pressure (BP) was elevated above 135/85 this visit, please have this repeated by your doctor within one month. --------------

## 2013-11-03 NOTE — ED Provider Notes (Signed)
Medical screening examination/treatment/procedure(s) were performed by non-physician practitioner and as supervising physician I was immediately available for consultation/collaboration.   EKG Interpretation None        Lyanne CoKevin M Riannah Stagner, MD 11/03/13 1039

## 2015-09-27 IMAGING — CR DG LUMBAR SPINE COMPLETE 4+V
5 series · 5 of 5 positions shown · non-contrast
Comparison: None.

CLINICAL DATA: Fell on ice.  Back pain

EXAM:
LUMBAR SPINE - COMPLETE 4+ VIEW

[t lumbar spine ap]
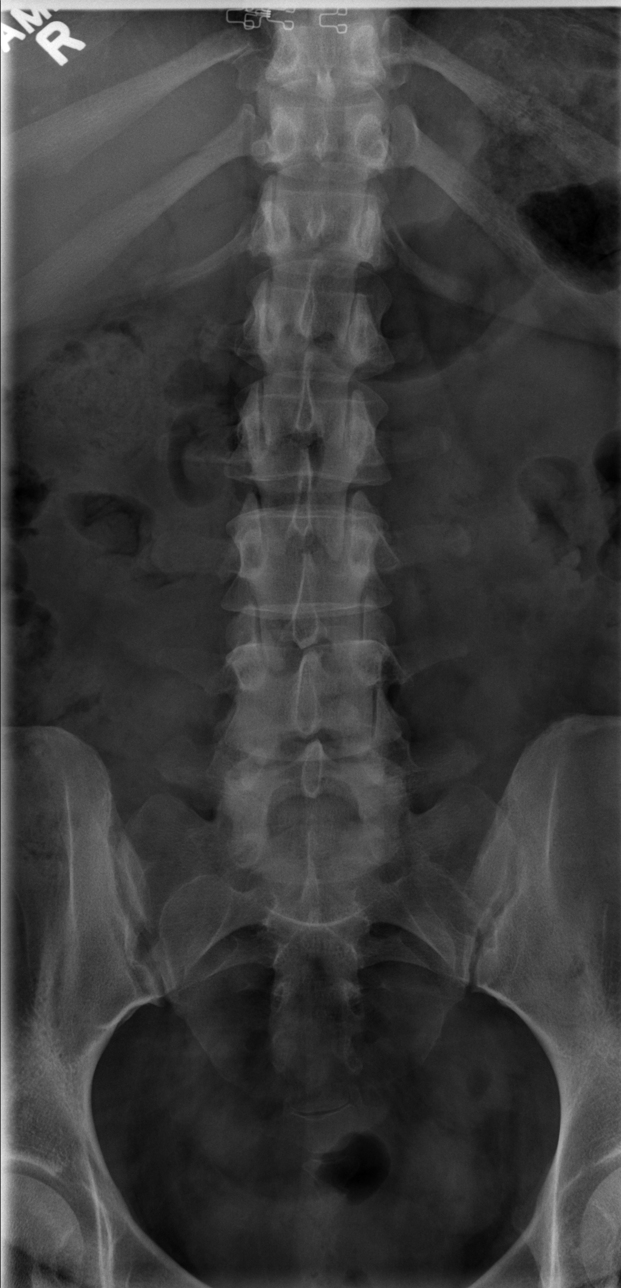

[t lumbar spine obl (1 of 2)]
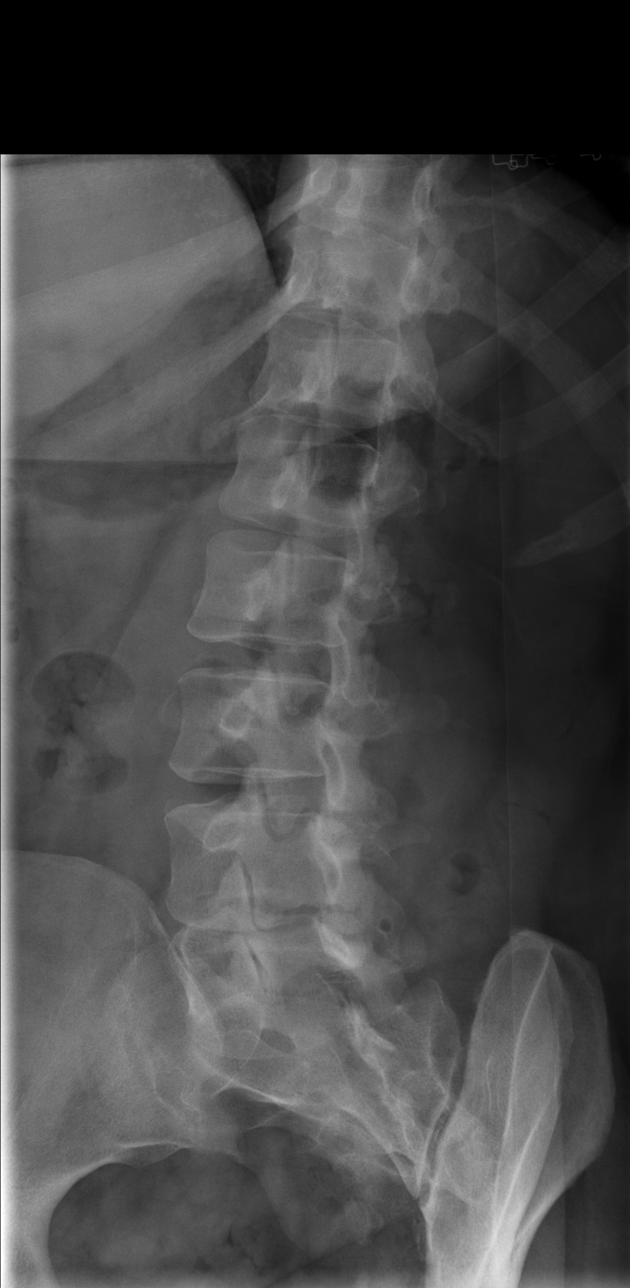

[t lumbar spine obl (2 of 2)]
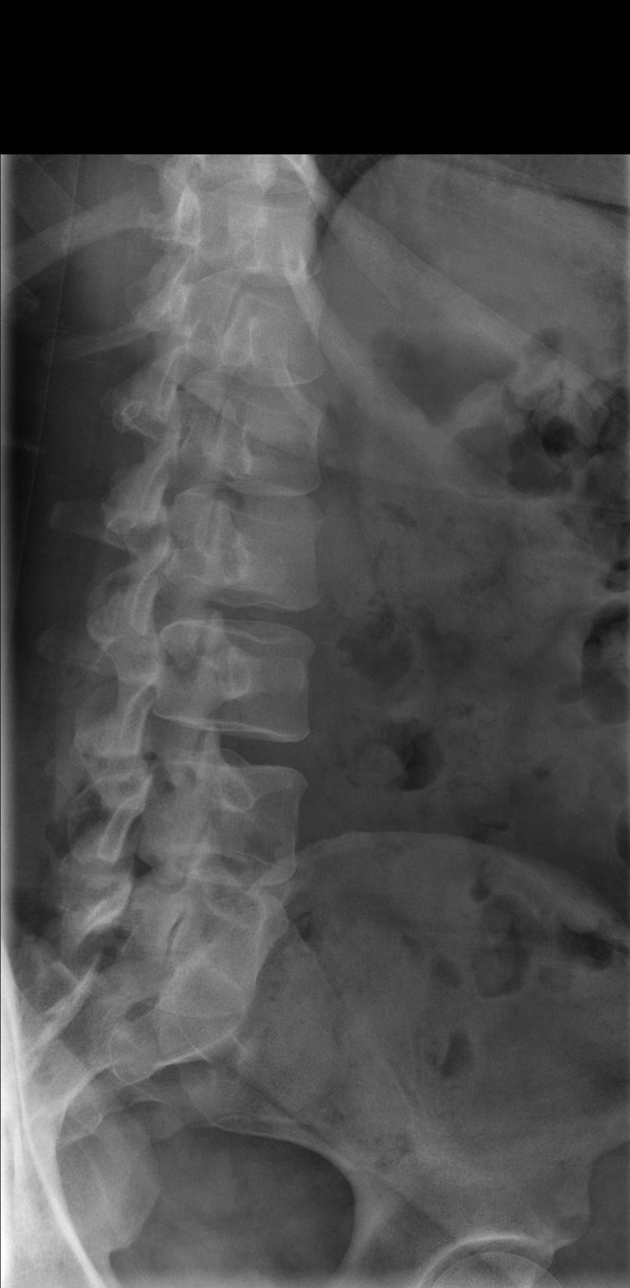

[t lumbar spine lat]
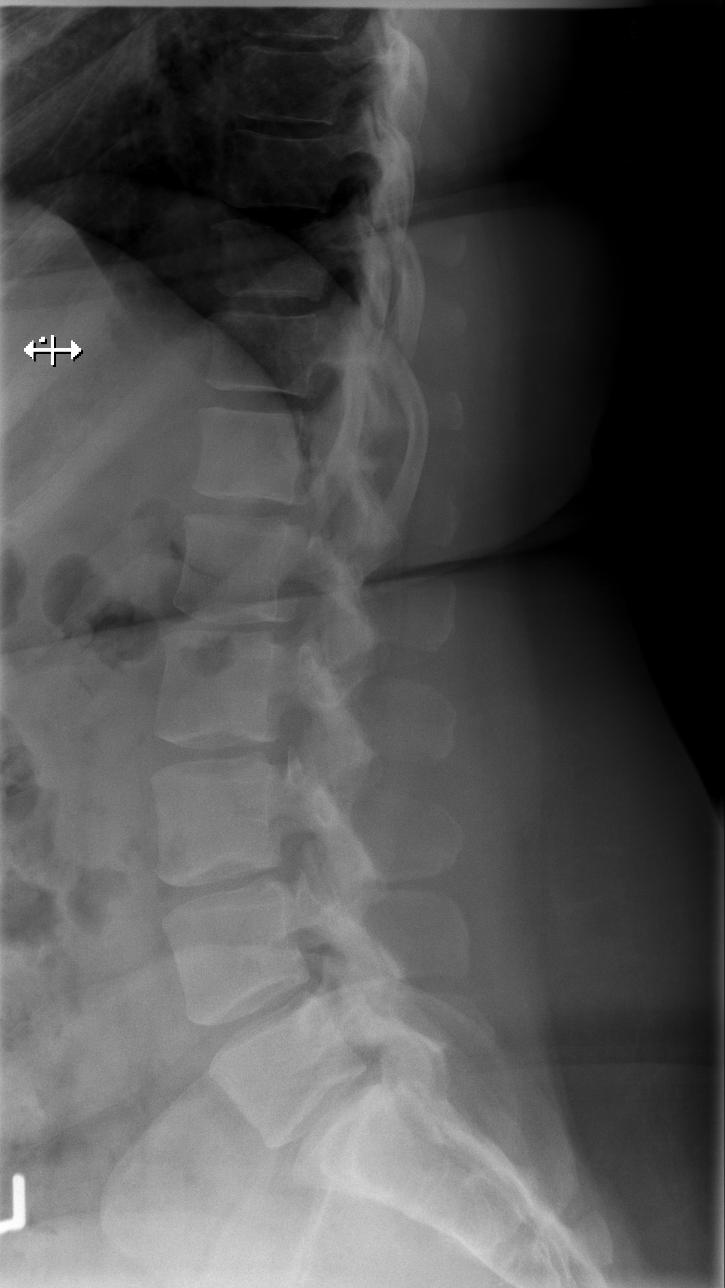

[t lumbar l-5 s-1 spot]
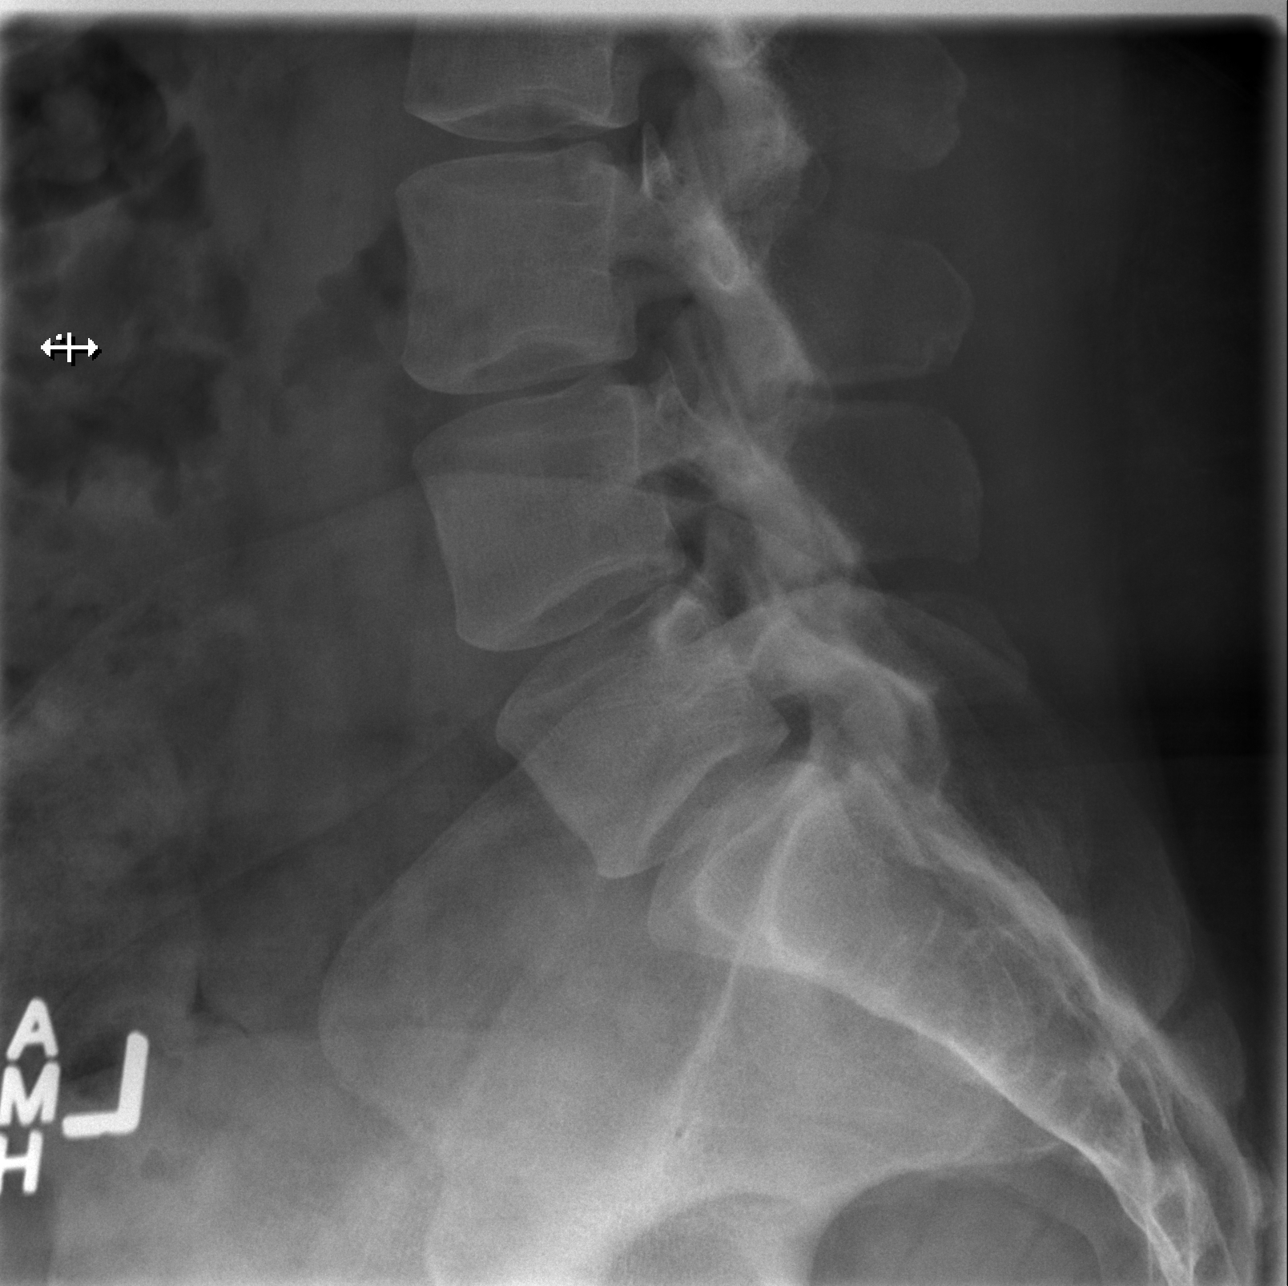

[5 of 5 positions shown; findings below may reference images not displayed]

FINDINGS: There is no evidence of lumbar spine fracture. Alignment is normal.
Intervertebral disc spaces are maintained.
IMPRESSION: Negative.

## 2022-11-07 ENCOUNTER — Telehealth: Payer: Self-pay | Admitting: General Practice

## 2022-11-07 NOTE — Telephone Encounter (Signed)
Caller name: JAYLIN KUZMINSKI  On DPR?: Yes  Call back number: [phone] (mobile)  Provider they see: Patient, No Pcp Per  Reason for call:  Pt stated that [facility] had called several times and she was returning the call. She asked that [facility] call her back

## 2022-11-07 NOTE — Telephone Encounter (Signed)
This is not our pt . I found our pt and this was placed in wrong pt chart
